# Patient Record
Sex: Female | Born: 2000 | Race: White | Hispanic: No | Marital: Single | Smoking: Never smoker
Health system: Southern US, Community
[De-identification: ages and names within clinical notes are randomized; demographics above are authoritative.]

---
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
## 2024-07-05 ENCOUNTER — Ambulatory Visit
Admission: RE | Admit: 2024-07-05 | Discharge: 2024-07-05 | Disposition: A | Discharge status: Home / Self Care | Payer: Self-pay | Source: Ambulatory Visit | Attending: Family Medicine | Admitting: Family Medicine

## 2024-07-05 VITALS — BP 131/82 | HR 93 | Temp 98.3°F | Resp 14

## 2024-07-05 DIAGNOSIS — Z113 Encounter for screening for infections with a predominantly sexual mode of transmission: Secondary | ICD-10-CM | POA: Insufficient documentation

## 2024-07-05 DIAGNOSIS — R21 Rash and other nonspecific skin eruption: Secondary | ICD-10-CM | POA: Diagnosis not present

## 2024-07-05 DIAGNOSIS — L739 Follicular disorder, unspecified: Secondary | ICD-10-CM | POA: Insufficient documentation

## 2024-07-05 MED ORDER — CEPHALEXIN 500 MG PO CAPS
500.0000 mg | ORAL_CAPSULE | Freq: Three times a day (TID) | ORAL | 0 refills | Status: AC
Start: 2024-07-05 — End: ?

## 2024-07-05 NOTE — ED Triage Notes (Signed)
 Pt reports has a blister in the right buttock x 3 days, blister in the crease of left leg x 2 days. Denies pain.

## 2024-07-05 NOTE — Discharge Instructions (Addendum)
 Start cephalexin  to address folliculitis. We'll let you know about your test results in the next 2-3 days.

## 2024-07-05 NOTE — ED Provider Notes (Signed)
 [facility]  Note:  This document was prepared using Conservation officer, historic buildings and may include unintentional dictation errors.  MRN: [id number] DOB: 01-Jul-2001  Subjective:   Jennfier E Montgomery is a 23 y.o. female presenting for multiple areas of concern in the genital region.  Has what she thinks is friction burn over the left groin, left vulvar area.  Also has 2 different resolving blisterlike lesions over each buttock.  The one on the left was from a known bug bite.  The one on the right was larger, formed a blister.  It did bring mild discomfort but was very transient.  It did rupture and since has not bothered the patient at all.  Has no concern for STI. Denies fever, n/v, abdominal pain, pelvic pain, rashes, dysuria, urinary frequency, hematuria, vaginal discharge.  However, she is agreeable to swab for herpes simplex of the left groin/vulva.   No current facility-administered medications for this encounter.  Current Outpatient Medications:    desloratadine-pseudoephedrine (CLARINEX-D 12-HOUR) 2.5-120 MG per tablet, Take 1 tablet by mouth 2 (two) times daily., Disp: , Rfl:    Multiple Vitamin (MULTIVITAMIN) tablet, Take 1 tablet by mouth daily., Disp: , Rfl:    No Known Allergies  History reviewed. No pertinent past medical history.   History reviewed. No pertinent surgical history.  Family History  Problem Relation Age of Onset   Hyperlipidemia Father    Other Father        hypogonadism    Social History   Tobacco Use   Smoking status: Never   Smokeless tobacco: Never  Substance Use Topics   Alcohol use: No   Drug use: No    ROS   Objective:   Vitals: BP 131/82 (BP Location: Right Arm)   Pulse 93   Temp 98.3 F (36.8 C) (Oral)   Resp 14   LMP  (Within Weeks) Comment: 3 weeks  SpO2 97%   Physical Exam Exam conducted with a chaperone present Restaurant manager, fast food).  Constitutional:      General: She is not in acute distress.     Appearance: Normal appearance. She is well-developed. She is not ill-appearing, toxic-appearing or diaphoretic.  HENT:     Head: Normocephalic and atraumatic.     Nose: Nose normal.     Mouth/Throat:     Mouth: Mucous membranes are moist.  Eyes:     General: No scleral icterus.       Right eye: No discharge.        Left eye: No discharge.     Extraocular Movements: Extraocular movements intact.  Cardiovascular:     Rate and Rhythm: Normal rate.  Pulmonary:     Effort: Pulmonary effort is normal.  Genitourinary:  Skin:    General: Skin is warm and dry.  Neurological:     General: No focal deficit present.     Mental Status: She is alert and oriented to person, place, and time.  Psychiatric:        Mood and Affect: Mood normal.        Behavior: Behavior normal.     Assessment and Plan :   PDMP not reviewed this encounter.  1. Folliculitis   2. Rash of genital area   3. Screen for STD (sexually transmitted disease)    Will manage for folliculitis with cephalexin .  Discussed general wound care.  Patient declined STI testing apart from HSV swabs.  Counseled patient on potential for adverse effects with medications  prescribed/recommended today, ER and return-to-clinic precautions discussed, patient verbalized understanding.    Phyllis Montgomery, [place] 07/05/24 [phone]

## 2024-07-05 NOTE — ED Notes (Signed)
 In with Mani PA-C for exam and swab collections

## 2024-07-06 LAB — HSV 1/2 PCR (SURFACE)
HSV-1 DNA: NOT DETECTED
HSV-1 DNA: NOT DETECTED
HSV-2 DNA: NOT DETECTED
HSV-2 DNA: NOT DETECTED
